# Patient Record
Sex: Male | Born: 1965
Health system: Southern US, Community
[De-identification: ages and names within clinical notes are randomized; demographics above are authoritative.]

## PROBLEM LIST (undated history)

## (undated) DIAGNOSIS — I10 Essential (primary) hypertension: Secondary | ICD-10-CM

---

## 2000-08-03 ENCOUNTER — Emergency Department (HOSPITAL_COMMUNITY): Admission: EM | Admit: 2000-08-03 | Discharge: 2000-08-04 | Payer: Self-pay | Admitting: Emergency Medicine

## 2000-08-03 ENCOUNTER — Encounter: Payer: Self-pay | Admitting: Emergency Medicine

## 2001-01-31 ENCOUNTER — Emergency Department (HOSPITAL_COMMUNITY): Admission: EM | Admit: 2001-01-31 | Discharge: 2001-01-31 | Payer: Self-pay | Admitting: Emergency Medicine

## 2001-09-22 ENCOUNTER — Emergency Department (HOSPITAL_COMMUNITY): Admission: EM | Admit: 2001-09-22 | Discharge: 2001-09-22 | Payer: Self-pay | Admitting: *Deleted

## 2002-10-09 ENCOUNTER — Ambulatory Visit (HOSPITAL_COMMUNITY): Admission: RE | Admit: 2002-10-09 | Discharge: 2002-10-09 | Payer: Self-pay | Admitting: Family Medicine

## 2002-10-09 ENCOUNTER — Encounter: Payer: Self-pay | Admitting: Family Medicine

## 2003-05-16 ENCOUNTER — Encounter: Payer: Self-pay | Admitting: Chiropractic Medicine

## 2003-05-16 ENCOUNTER — Ambulatory Visit (HOSPITAL_COMMUNITY): Admission: RE | Admit: 2003-05-16 | Discharge: 2003-05-16 | Payer: Self-pay | Admitting: Chiropractic Medicine

## 2003-07-13 ENCOUNTER — Emergency Department (HOSPITAL_COMMUNITY): Admission: AD | Admit: 2003-07-13 | Discharge: 2003-07-13 | Payer: Self-pay | Admitting: Family Medicine

## 2003-07-24 ENCOUNTER — Emergency Department (HOSPITAL_COMMUNITY): Admission: AD | Admit: 2003-07-24 | Discharge: 2003-07-24 | Payer: Self-pay | Admitting: Family Medicine

## 2004-03-03 ENCOUNTER — Ambulatory Visit (HOSPITAL_COMMUNITY): Admission: RE | Admit: 2004-03-03 | Discharge: 2004-03-03 | Payer: Self-pay | Admitting: Internal Medicine

## 2007-11-12 ENCOUNTER — Emergency Department (HOSPITAL_COMMUNITY): Admission: EM | Admit: 2007-11-12 | Discharge: 2007-11-12 | Payer: Self-pay | Admitting: Family Medicine

## 2009-04-11 ENCOUNTER — Emergency Department (HOSPITAL_COMMUNITY): Admission: EM | Admit: 2009-04-11 | Discharge: 2009-04-11 | Payer: Self-pay | Admitting: Emergency Medicine

## 2009-05-21 ENCOUNTER — Emergency Department (HOSPITAL_COMMUNITY): Admission: EM | Admit: 2009-05-21 | Discharge: 2009-05-21 | Payer: Self-pay | Admitting: Emergency Medicine

## 2010-03-27 ENCOUNTER — Emergency Department (HOSPITAL_COMMUNITY): Admission: EM | Admit: 2010-03-27 | Discharge: 2010-03-27 | Payer: Self-pay | Admitting: Emergency Medicine

## 2010-09-24 ENCOUNTER — Encounter: Payer: Self-pay | Admitting: Family Medicine

## 2011-05-04 ENCOUNTER — Emergency Department (HOSPITAL_COMMUNITY)
Admission: EM | Admit: 2011-05-04 | Discharge: 2011-05-05 | Disposition: A | Discharge status: Home / Self Care | Payer: Self-pay | Attending: Emergency Medicine | Admitting: Emergency Medicine

## 2011-05-04 DIAGNOSIS — R51 Headache: Secondary | ICD-10-CM | POA: Insufficient documentation

## 2011-05-04 DIAGNOSIS — I10 Essential (primary) hypertension: Secondary | ICD-10-CM | POA: Insufficient documentation

## 2011-05-28 LAB — POCT I-STAT, CHEM 8
HCT: 53 — ABNORMAL HIGH
Hemoglobin: 18 — ABNORMAL HIGH
Potassium: 4.1
Sodium: 137

## 2011-10-24 ENCOUNTER — Other Ambulatory Visit: Payer: Self-pay

## 2011-10-24 ENCOUNTER — Emergency Department (INDEPENDENT_AMBULATORY_CARE_PROVIDER_SITE_OTHER)
Admission: EM | Admit: 2011-10-24 | Discharge: 2011-10-24 | Disposition: A | Discharge status: Home Health | Payer: Self-pay | Source: Home / Self Care | Attending: Family Medicine | Admitting: Family Medicine

## 2011-10-24 ENCOUNTER — Encounter (HOSPITAL_COMMUNITY): Payer: Self-pay | Admitting: Emergency Medicine

## 2011-10-24 DIAGNOSIS — IMO0002 Reserved for concepts with insufficient information to code with codable children: Secondary | ICD-10-CM

## 2011-10-24 DIAGNOSIS — S46911A Strain of unspecified muscle, fascia and tendon at shoulder and upper arm level, right arm, initial encounter: Secondary | ICD-10-CM

## 2011-10-24 HISTORY — DX: Essential (primary) hypertension: I10

## 2011-10-24 MED ORDER — NAPROXEN 500 MG PO TABS: 500.0000 mg | ORAL_TABLET | Freq: Two times a day (BID) | ORAL | Status: AC

## 2011-10-24 MED ORDER — CYCLOBENZAPRINE HCL 5 MG PO TABS: 5.0000 mg | ORAL_TABLET | Freq: Every evening | ORAL | Status: AC | PRN

## 2011-10-24 NOTE — ED Provider Notes (Addendum)
History     CSN: [id number]  Arrival date & time 10/24/11  0906   First MD Initiated Contact with Patient 10/24/11 1018      Chief Complaint  Patient presents with  . Chest Pain    (Consider location/radiation/quality/duration/timing/severity/associated sxs/prior treatment) HPI Comments: Nathan Morris presents for evaluation of right-sided shoulder pain. He reports onset of symptoms around the [place] in early February. He denies any injury. He states these recently changed his pillows, but this was after the pain started. He does report that he change his mattress and box spring and early January. He recently bought a sleep number bed. He denies any numbness, tingling, or weakness in his upper extremities. He denies any chest pain. The pain is not exacerbated by movement and occurs at rest. He reports that putting his hand behind his head does help alleviate the pain. He reports that the pain is also alleviated with Tylenol and Alka-Seltzer. He also reports they used a hot water bottle, which helped also.  Patient is a 46 y.o. male presenting with shoulder pain. The history is provided by the patient.  Shoulder Pain This is a new problem. The current episode started more than 1 week ago. The problem has not changed since onset.Pertinent negatives include no chest pain and no headaches. The symptoms are aggravated by nothing. He has tried acetaminophen and a warm compress for the symptoms.    Past Medical History  Diagnosis Date  . Hypertension     History reviewed. No pertinent past surgical history.  No family history on file.  History  Substance Use Topics  . Smoking status: Not on file  . Smokeless tobacco: Not on file  . Alcohol Use: Yes      Review of Systems  Constitutional: Negative.   HENT: Negative.   Eyes: Negative.   Respiratory: Negative.   Cardiovascular: Negative.  Negative for chest pain.  Gastrointestinal: Negative.   Genitourinary: Negative.     Musculoskeletal: Positive for myalgias and arthralgias.  Skin: Negative.   Neurological: Negative.  Negative for headaches.    Allergies  Review of patient's allergies indicates no known allergies.  Home Medications   Current Outpatient Rx  Name Route Sig Dispense Refill  . CYCLOBENZAPRINE HCL 5 MG PO TABS Oral Take 1 tablet (5 mg total) by mouth at bedtime as needed for muscle spasms. 10 tablet 0  . NAPROXEN 500 MG PO TABS Oral Take 1 tablet (500 mg total) by mouth 2 (two) times daily. 30 tablet 0    BP 145/98  Pulse 92  Temp(Src) 98.9 F (37.2 C) (Oral)  Resp 16  SpO2 97%  Physical Exam  Nursing note and vitals reviewed. Constitutional: He is oriented to person, place, and time. He appears well-developed and well-nourished.  HENT:  Head: Normocephalic and atraumatic.  Eyes: EOM are normal.  Neck: Normal range of motion.  Cardiovascular: Normal rate and regular rhythm.        ECG: NSR, rate 75, no ST or T wave changes  Pulmonary/Chest: Effort normal and breath sounds normal. He has no wheezes. He has no rales.  Musculoskeletal: Normal range of motion.       Right shoulder: He exhibits tenderness and pain. He exhibits normal strength.       RIGHT shoulder: full abduction, adduction, flexion, and extension; 5/5 strength with internal and external rotation; 5/5 grip strength; negative Hawkin's, negative Ober's, negative Neer's, negative Speed's test; tenderness to palpation over RIGHT trapezius  Neurological: He is alert  and oriented to person, place, and time.  Skin: Skin is warm and dry.  Psychiatric: His behavior is normal.    ED Course  Procedures (including critical care time)  Labs Reviewed - No data to display No results found.   1. Muscle strain of right shoulder       MDM  rx given for naproxen and cyclobenzaprine PRN        Richardo Priest, MD 10/24/11 1108  Richardo Priest, MD 10/24/11 1109

## 2011-10-24 NOTE — Discharge Instructions (Signed)
Take medications as directed. Use mild heat (heating pad, warm baths, etc) for 10 to 15 minutes, two to three times daily, as needed and as tolerated, taking care to not burn the skin. Begin stretches and exercises, as instructed in handouts, after 48 hours. Return to care should your symptoms not improve, or worsen in any way, such as numbness, weakness, or tingling.   

## 2011-10-24 NOTE — ED Notes (Signed)
PT HERE WITH RIGHT CHEST WALL PAIN THAT RADIATES TO RIGHT SHOULDER INTERMITT.SX ACHY PAIN THAT STARTED X3 WEEKS AGO BUT HAS PROGRESSED WITH LYING DOWN OR CERTAIN MOVEMENT.HXHTN AND TAKES BP MEDS DAILY

## 2011-10-31 ENCOUNTER — Emergency Department (INDEPENDENT_AMBULATORY_CARE_PROVIDER_SITE_OTHER)
Admission: EM | Admit: 2011-10-31 | Discharge: 2011-10-31 | Disposition: A | Discharge status: Home / Self Care | Payer: Self-pay | Source: Home / Self Care | Attending: Emergency Medicine | Admitting: Emergency Medicine

## 2011-10-31 ENCOUNTER — Telehealth (HOSPITAL_COMMUNITY): Payer: Self-pay | Admitting: *Deleted

## 2011-10-31 ENCOUNTER — Encounter (HOSPITAL_COMMUNITY): Payer: Self-pay | Admitting: *Deleted

## 2011-10-31 ENCOUNTER — Emergency Department (INDEPENDENT_AMBULATORY_CARE_PROVIDER_SITE_OTHER): Payer: Self-pay

## 2011-10-31 DIAGNOSIS — M25519 Pain in unspecified shoulder: Secondary | ICD-10-CM

## 2011-10-31 MED ORDER — TRAMADOL HCL 50 MG PO TABS: 50.0000 mg | ORAL_TABLET | Freq: Four times a day (QID) | ORAL | Status: AC | PRN

## 2011-10-31 NOTE — ED Notes (Signed)
Pt. called on VM @ 1518 but did not leave a message. I called back and he said he was at the [facility] now being seen. I said that was OK and asked his name to document call. Nathan Morris 10/31/2011

## 2011-10-31 NOTE — Discharge Instructions (Signed)
Arthralgia Arthralgia is joint pain. A joint is a place where two bones meet. Joint pain can happen for many reasons. The joint can be bruised, stiff, infected, or weak from aging. Pain usually goes away after resting and taking medicine for soreness.  HOME CARE  Rest the joint as told by your doctor.   Keep the sore joint raised (elevated) for the first 24 hours.   Put ice on the joint area.   Put ice in a plastic bag.   Place a towel between your skin and the bag   Leave the ice on for 15 to 20 minutes, 3 to 4 times a day.   Wear your splint, casting, elastic bandage, or sling as told by your doctor.   Only take medicine as told by your doctor. Do not take aspirin.   Use crutches as told by your doctor. Do not put weight on the joint until told to by your doctor.  GET HELP RIGHT AWAY IF:   You have bruising, puffiness (swelling), or more pain.   Your fingers or toes turn blue or start to lose feeling (numb).   Your medicine does not lessen the pain.   Your pain becomes severe.   You have a temperature by mouth above 102 F (38.9 C), not controlled by medicine.   You cannot move or use the joint.  MAKE SURE YOU:   Understand these instructions.   Will watch your condition.   Will get help right away if you are not doing well or get worse.  Document Released: 08/08/2009 Document Revised: 05/02/2011 Document Reviewed: 08/08/2009 [facility] Patient Information 2012 [place], [place].

## 2011-10-31 NOTE — ED Notes (Signed)
Pt states he has constant right sided chest pain for about 4 weeks.  Seen here last week and given Naprosyn and Flexeril which he has been taking along with Ibuprofen.  States pain doesn't go away.  Also now coughing up brown mucus.  Instructed pt not to take Ibuprofen and Naprosyn together.

## 2011-10-31 NOTE — ED Provider Notes (Signed)
Nathan Morris is a 46 y.o. male who presents to Urgent Care today for shoulder pain and right chest pain. Patient was seen here at urgent care about 7 days ago with similar symptoms. He states that the symptoms have improved whenever he takes naproxen or ibuprofen. He has returned today to make sure that there is nothing else going on and is requesting a chest x-ray to make sure he doesn't have any rib injury. He states there were some days where he took both of ibuprofen and naproxen together. He states he had one episode of cough about 3 days ago he coughed up some "brown stuff." This was not even enough to fill the bottom of a very small cup. He has not done this since then and does not have any other cough. However he does endorse some night sweats, polyuria, polydipsia. He states he awakens at 2 times a night to urinate. No trauma to the shoulder or chest. No dyspnea on exertion. No chest pain on exertion. States he feels pain mostly when lifting arm above head or trying to lift something at work. Describes pain as a sharp steady pain or aching. No lower extremity edema. No abdominal pain, nausea, vomiting.   PMH reviewed.  ROS as above otherwise neg Medications reviewed. No current facility-administered medications for this encounter.   Current Outpatient Prescriptions  Medication Sig Dispense Refill  . HYDROCHLOROTHIAZIDE PO Take by mouth.      . cyclobenzaprine (FLEXERIL) 5 MG tablet Take 1 tablet (5 mg total) by mouth at bedtime as needed for muscle spasms.  10 tablet  0  . naproxen (NAPROSYN) 500 MG tablet Take 1 tablet (500 mg total) by mouth 2 (two) times daily.  30 tablet  0    Exam:  BP 146/96  Pulse 83  Temp(Src) 98.3 F (36.8 C) (Oral)  Resp 16  SpO2 98% Gen: Well NAD HEENT: EOMI,  MMM Lungs: CTABL Nl WOB Chest: Mild tenderness to palpation right a.c. joint, right pectoralis major muscle the axilla. Heart: RRR no MRG Abd: NABS, NT, ND Exts: Non edematous BL  LE, warm  and well perfused.   I have reviewed imaging, imaging report, and lab results and agree with read.   Assessment and Plan:  1.  musculoskeletal chest and shoulder pain: This pain does improve with anti-inflammatory and is worsened with movement and he has no history of cardiac disease or any cardiac risk factors plan to continue to treat with anti-inflammatories. Gave warnings regarding taking ibuprofen and naproxen together and asked him to site which helps him more and to only use that. Did obtain chest x-ray which was negative for any pulmonary process or rib pathology. He does have a PCP and refill asked him to followup with his PCP in the next several days to ensure he is continued to improve.  #2 polyuria/polydipsia: Glucose obtained on I-Stat chemistry panel was 86. He last had a male several hours ago. I do not believe he has concerns for diabetes but did recommend him to followup with his primary care provider next week to ensure this. May need A1c at that time.

## 2011-11-01 LAB — POCT I-STAT, CHEM 8
Calcium, Ion: 1.3 mmol/L (ref 1.12–1.32)
Chloride: 110 mEq/L (ref 96–112)
HCT: 47 % (ref 39.0–52.0)
Hemoglobin: 16 g/dL (ref 13.0–17.0)
Potassium: 4.7 mEq/L (ref 3.5–5.1)

## 2011-11-02 NOTE — ED Provider Notes (Signed)
Medical screening examination/treatment/procedure(s) were performed by resident physician or non-physician practitioner and as supervising physician I was immediately available for consultation/collaboration.   Barkley Bruns MD.    Barkley Bruns, MD 11/02/11 [phone]

## 2015-11-23 ENCOUNTER — Emergency Department (HOSPITAL_COMMUNITY): Payer: Self-pay

## 2015-11-23 ENCOUNTER — Emergency Department (HOSPITAL_COMMUNITY)
Admission: EM | Admit: 2015-11-23 | Discharge: 2015-11-23 | Disposition: A | Discharge status: Home / Self Care | Payer: Self-pay | Attending: Emergency Medicine | Admitting: Emergency Medicine

## 2015-11-23 ENCOUNTER — Encounter (HOSPITAL_COMMUNITY): Payer: Self-pay | Admitting: Emergency Medicine

## 2015-11-23 DIAGNOSIS — I10 Essential (primary) hypertension: Secondary | ICD-10-CM | POA: Insufficient documentation

## 2015-11-23 DIAGNOSIS — R52 Pain, unspecified: Secondary | ICD-10-CM | POA: Insufficient documentation

## 2015-11-23 DIAGNOSIS — R05 Cough: Secondary | ICD-10-CM | POA: Insufficient documentation

## 2015-11-23 NOTE — ED Notes (Signed)
Patient here with complaints of cough, generalized body aches x1 week. Reports being out of blood pressure medication x1 week.

## 2017-06-27 DIAGNOSIS — M25511 Pain in right shoulder: Secondary | ICD-10-CM | POA: Diagnosis not present

## 2017-06-27 DIAGNOSIS — R001 Bradycardia, unspecified: Secondary | ICD-10-CM | POA: Diagnosis not present

## 2017-06-27 DIAGNOSIS — R109 Unspecified abdominal pain: Secondary | ICD-10-CM | POA: Diagnosis not present

## 2017-07-01 DIAGNOSIS — M5412 Radiculopathy, cervical region: Secondary | ICD-10-CM | POA: Diagnosis not present

## 2017-07-01 DIAGNOSIS — M25511 Pain in right shoulder: Secondary | ICD-10-CM | POA: Diagnosis not present

## 2017-07-02 DIAGNOSIS — R001 Bradycardia, unspecified: Secondary | ICD-10-CM | POA: Diagnosis not present

## 2017-07-09 DIAGNOSIS — R55 Syncope and collapse: Secondary | ICD-10-CM | POA: Diagnosis not present

## 2017-07-09 DIAGNOSIS — R001 Bradycardia, unspecified: Secondary | ICD-10-CM | POA: Diagnosis not present

## 2017-07-16 DIAGNOSIS — R001 Bradycardia, unspecified: Secondary | ICD-10-CM | POA: Diagnosis not present

## 2017-07-18 DIAGNOSIS — M5020 Other cervical disc displacement, unspecified cervical region: Secondary | ICD-10-CM | POA: Diagnosis not present

## 2017-07-18 DIAGNOSIS — Z135 Encounter for screening for eye and ear disorders: Secondary | ICD-10-CM | POA: Diagnosis not present

## 2017-07-18 DIAGNOSIS — M2578 Osteophyte, vertebrae: Secondary | ICD-10-CM | POA: Diagnosis not present

## 2017-10-16 ENCOUNTER — Ambulatory Visit (HOSPITAL_COMMUNITY)
Admission: EM | Admit: 2017-10-16 | Discharge: 2017-10-16 | Disposition: A | Discharge status: Home / Self Care | Payer: 59 | Attending: Family Medicine | Admitting: Family Medicine

## 2017-10-16 ENCOUNTER — Encounter (HOSPITAL_COMMUNITY): Payer: Self-pay | Admitting: Emergency Medicine

## 2017-10-16 ENCOUNTER — Other Ambulatory Visit: Payer: Self-pay

## 2017-10-16 DIAGNOSIS — K5900 Constipation, unspecified: Secondary | ICD-10-CM | POA: Diagnosis not present

## 2017-10-16 MED ORDER — HYDROCHLOROTHIAZIDE 25 MG PO TABS: 25.0000 mg | ORAL_TABLET | Freq: Every day | ORAL | 1 refills | Status: AC

## 2017-10-16 NOTE — ED Triage Notes (Signed)
Pt reports right flank pain and no BM x2 days.  He states he had one very small BM that was small hard balls.  He has not taken anything OTC.

## 2017-10-16 NOTE — ED Provider Notes (Signed)
  [place]   [id number] 10/16/17 Arrival Time: 4332  ASSESSMENT & PLAN:  1. Constipation, unspecified constipation type    Trial of OTC medications; see AVS. May f/u here if not improving over the next couple of days.  Reviewed expectations re: course of current medical issues. Questions answered. Outlined signs and symptoms indicating need for more acute intervention. Patient verbalized understanding. After Visit Summary given.   SUBJECTIVE:  Nathan Morris is a 52 y.o. male who presents with complaint of constipation for 2 days. Last BM small and without blood. Similar symptoms about 10 years ago that resolved quickly. No new medications or recent changes in his health. Normal appetite and PO intake. Passing gas which provides a little relief. No abdominal pain "but feel a little bloated." Afebrile. No urinary symptoms. No OTC treatment. No specific aggravating factors reported.  History reviewed. No pertinent surgical history.  ROS: As per HPI.  OBJECTIVE:  Vitals:   10/16/17 1145  BP: (!) 164/107  Pulse: 66  Temp: 97.7 F (36.5 C)  TempSrc: Oral  SpO2: 96%    General appearance: alert; no distress Lungs: clear to auscultation bilaterally Heart: regular rate and rhythm Abdomen: soft; non-distended; no tenderness; bowel sounds present; no masses or organomegaly; no guarding or rebound tenderness Back: no CVA tenderness; FROM at hips Extremities: no edema; symmetrical with no gross deformities Skin: warm and dry Neurologic: normal gait Psychological: alert and cooperative; normal mood and affect  No Known Allergies                                             Past Medical History:  Diagnosis Date  . Hypertension    Social History   Socioeconomic History  . Marital status: Single    Spouse name: Not on file  . Number of children: Not on file  . Years of education: Not on file  . Highest education level: Not on file  Social Needs  . Financial  resource strain: Not on file  . Food insecurity - worry: Not on file  . Food insecurity - inability: Not on file  . Transportation needs - medical: Not on file  . Transportation needs - non-medical: Not on file  Occupational History  . Not on file  Tobacco Use  . Smoking status: Never Smoker  . Smokeless tobacco: Never Used  Substance and Sexual Activity  . Alcohol use: Yes  . Drug use: Yes    Types: Marijuana    Comment: frequent use  . Sexual activity: Not on file  Other Topics Concern  . Not on file  Social History Narrative  . Not on file   Family History  Problem Relation Age of Onset  . Hypertension Mother      Vanessa Kick, MD 10/16/17 1324

## 2017-10-16 NOTE — Discharge Instructions (Addendum)
For your constipation you may try over the counter Miralax. If this does not produce a bowel movement within two days you may try over the counter magnesium citrate.  Please return here or to the Emergency Department immediately should you feel worse in any way or have any of the following symptoms: increasing or different abdominal pain, persistent vomiting, fevers, or shaking chills.

## 2017-11-28 ENCOUNTER — Other Ambulatory Visit: Payer: Self-pay | Admitting: Family Medicine

## 2017-11-28 DIAGNOSIS — N442 Benign cyst of testis: Secondary | ICD-10-CM

## 2017-11-28 DIAGNOSIS — J301 Allergic rhinitis due to pollen: Secondary | ICD-10-CM | POA: Diagnosis not present

## 2017-12-02 ENCOUNTER — Other Ambulatory Visit: Payer: 59

## 2017-12-10 ENCOUNTER — Ambulatory Visit
Admission: RE | Admit: 2017-12-10 | Discharge: 2017-12-10 | Disposition: A | Discharge status: Home / Self Care | Payer: 59 | Source: Ambulatory Visit | Attending: Family Medicine | Admitting: Family Medicine

## 2017-12-10 DIAGNOSIS — N442 Benign cyst of testis: Secondary | ICD-10-CM

## 2017-12-10 DIAGNOSIS — N509 Disorder of male genital organs, unspecified: Secondary | ICD-10-CM | POA: Diagnosis not present

## 2018-01-10 DIAGNOSIS — D2931 Benign neoplasm of right epididymis: Secondary | ICD-10-CM | POA: Diagnosis not present

## 2018-02-13 DIAGNOSIS — M25511 Pain in right shoulder: Secondary | ICD-10-CM | POA: Diagnosis not present

## 2018-04-04 DIAGNOSIS — Z0000 Encounter for general adult medical examination without abnormal findings: Secondary | ICD-10-CM | POA: Diagnosis not present

## 2018-04-04 DIAGNOSIS — I10 Essential (primary) hypertension: Secondary | ICD-10-CM | POA: Diagnosis not present

## 2018-06-12 DIAGNOSIS — D124 Benign neoplasm of descending colon: Secondary | ICD-10-CM | POA: Diagnosis not present

## 2018-06-12 DIAGNOSIS — Z1211 Encounter for screening for malignant neoplasm of colon: Secondary | ICD-10-CM | POA: Diagnosis not present

## 2018-06-12 DIAGNOSIS — K635 Polyp of colon: Secondary | ICD-10-CM | POA: Diagnosis not present

## 2018-08-06 DIAGNOSIS — H2513 Age-related nuclear cataract, bilateral: Secondary | ICD-10-CM | POA: Diagnosis not present

## 2018-08-06 DIAGNOSIS — H25013 Cortical age-related cataract, bilateral: Secondary | ICD-10-CM | POA: Diagnosis not present

## 2018-08-06 DIAGNOSIS — H40013 Open angle with borderline findings, low risk, bilateral: Secondary | ICD-10-CM | POA: Diagnosis not present

## 2018-11-06 DIAGNOSIS — R062 Wheezing: Secondary | ICD-10-CM | POA: Diagnosis not present

## 2018-11-06 DIAGNOSIS — J988 Other specified respiratory disorders: Secondary | ICD-10-CM | POA: Diagnosis not present

## 2019-05-18 ENCOUNTER — Other Ambulatory Visit: Payer: Self-pay | Admitting: Family Medicine

## 2019-05-18 ENCOUNTER — Ambulatory Visit
Admission: RE | Admit: 2019-05-18 | Discharge: 2019-05-18 | Disposition: A | Discharge status: Home / Self Care | Payer: 59 | Source: Ambulatory Visit | Attending: Family Medicine | Admitting: Family Medicine

## 2019-05-18 DIAGNOSIS — R079 Chest pain, unspecified: Secondary | ICD-10-CM

## 2019-08-06 ENCOUNTER — Other Ambulatory Visit: Payer: Self-pay

## 2019-08-06 DIAGNOSIS — Z20822 Contact with and (suspected) exposure to covid-19: Secondary | ICD-10-CM

## 2019-08-09 LAB — NOVEL CORONAVIRUS, NAA: SARS-CoV-2, NAA: NOT DETECTED

## 2019-09-15 DIAGNOSIS — Z20828 Contact with and (suspected) exposure to other viral communicable diseases: Secondary | ICD-10-CM | POA: Diagnosis not present

## 2019-09-15 DIAGNOSIS — K591 Functional diarrhea: Secondary | ICD-10-CM | POA: Diagnosis not present

## 2019-09-15 DIAGNOSIS — R197 Diarrhea, unspecified: Secondary | ICD-10-CM | POA: Diagnosis not present

## 2019-09-15 DIAGNOSIS — R519 Headache, unspecified: Secondary | ICD-10-CM | POA: Diagnosis not present

## 2019-09-15 DIAGNOSIS — R05 Cough: Secondary | ICD-10-CM | POA: Diagnosis not present

## 2019-09-24 ENCOUNTER — Other Ambulatory Visit: Payer: Self-pay | Admitting: Family Medicine

## 2019-09-24 ENCOUNTER — Ambulatory Visit
Admission: RE | Admit: 2019-09-24 | Discharge: 2019-09-24 | Disposition: A | Discharge status: Home / Self Care | Payer: BC Managed Care – PPO | Source: Ambulatory Visit | Attending: Family Medicine | Admitting: Family Medicine

## 2019-09-24 DIAGNOSIS — M79671 Pain in right foot: Secondary | ICD-10-CM

## 2019-10-26 ENCOUNTER — Encounter (HOSPITAL_COMMUNITY): Payer: Self-pay | Admitting: Emergency Medicine

## 2019-10-26 ENCOUNTER — Other Ambulatory Visit: Payer: Self-pay

## 2019-10-26 ENCOUNTER — Emergency Department (HOSPITAL_COMMUNITY): Payer: BC Managed Care – PPO

## 2019-10-26 ENCOUNTER — Emergency Department (HOSPITAL_COMMUNITY)
Admission: EM | Admit: 2019-10-26 | Discharge: 2019-10-26 | Disposition: A | Discharge status: Home / Self Care | Payer: BC Managed Care – PPO | Attending: Emergency Medicine | Admitting: Emergency Medicine

## 2019-10-26 DIAGNOSIS — M542 Cervicalgia: Secondary | ICD-10-CM | POA: Diagnosis not present

## 2019-10-26 DIAGNOSIS — M546 Pain in thoracic spine: Secondary | ICD-10-CM | POA: Diagnosis not present

## 2019-10-26 DIAGNOSIS — R519 Headache, unspecified: Secondary | ICD-10-CM | POA: Diagnosis not present

## 2019-10-26 DIAGNOSIS — Y939 Activity, unspecified: Secondary | ICD-10-CM | POA: Diagnosis not present

## 2019-10-26 DIAGNOSIS — I10 Essential (primary) hypertension: Secondary | ICD-10-CM | POA: Diagnosis not present

## 2019-10-26 DIAGNOSIS — Z79899 Other long term (current) drug therapy: Secondary | ICD-10-CM | POA: Diagnosis not present

## 2019-10-26 DIAGNOSIS — M62838 Other muscle spasm: Secondary | ICD-10-CM | POA: Insufficient documentation

## 2019-10-26 DIAGNOSIS — S299XXA Unspecified injury of thorax, initial encounter: Secondary | ICD-10-CM | POA: Diagnosis not present

## 2019-10-26 DIAGNOSIS — Y92410 Unspecified street and highway as the place of occurrence of the external cause: Secondary | ICD-10-CM | POA: Insufficient documentation

## 2019-10-26 DIAGNOSIS — S199XXA Unspecified injury of neck, initial encounter: Secondary | ICD-10-CM | POA: Diagnosis not present

## 2019-10-26 DIAGNOSIS — Y999 Unspecified external cause status: Secondary | ICD-10-CM | POA: Insufficient documentation

## 2019-10-26 DIAGNOSIS — S0990XA Unspecified injury of head, initial encounter: Secondary | ICD-10-CM | POA: Diagnosis not present

## 2019-10-26 MED ORDER — HYDROCHLOROTHIAZIDE 12.5 MG PO CAPS
25.0000 mg | ORAL_CAPSULE | Freq: Once | ORAL | Status: AC
  Administered 2019-10-26: 25 mg via ORAL
  Filled 2019-10-26: qty 2

## 2019-10-26 MED ORDER — CYCLOBENZAPRINE HCL 10 MG PO TABS: 10.0000 mg | ORAL_TABLET | Freq: Two times a day (BID) | ORAL | 0 refills | Status: AC | PRN
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
## 2019-10-26 NOTE — ED Notes (Signed)
Pt transported to CT/Xray via stretcher. A/Ox3.  NAD. No complaints voiced.
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
## 2019-12-09 DIAGNOSIS — I10 Essential (primary) hypertension: Secondary | ICD-10-CM | POA: Diagnosis not present

## 2019-12-09 DIAGNOSIS — Z13220 Encounter for screening for lipoid disorders: Secondary | ICD-10-CM | POA: Diagnosis not present

## 2019-12-09 DIAGNOSIS — Z0000 Encounter for general adult medical examination without abnormal findings: Secondary | ICD-10-CM | POA: Diagnosis not present

## 2019-12-09 DIAGNOSIS — Z125 Encounter for screening for malignant neoplasm of prostate: Secondary | ICD-10-CM | POA: Diagnosis not present

## 2019-12-31 ENCOUNTER — Ambulatory Visit: Payer: BC Managed Care – PPO | Admitting: Podiatry

## 2020-01-04 ENCOUNTER — Other Ambulatory Visit: Payer: Self-pay | Admitting: Podiatry

## 2020-01-04 ENCOUNTER — Other Ambulatory Visit: Payer: Self-pay

## 2020-01-04 ENCOUNTER — Ambulatory Visit (INDEPENDENT_AMBULATORY_CARE_PROVIDER_SITE_OTHER): Payer: BC Managed Care – PPO

## 2020-01-04 ENCOUNTER — Ambulatory Visit: Payer: BC Managed Care – PPO | Admitting: Podiatry

## 2020-01-04 ENCOUNTER — Encounter: Payer: Self-pay | Admitting: Podiatry

## 2020-01-04 VITALS — Temp 98.2°F

## 2020-01-04 DIAGNOSIS — M722 Plantar fascial fibromatosis: Secondary | ICD-10-CM

## 2020-01-04 DIAGNOSIS — R52 Pain, unspecified: Secondary | ICD-10-CM

## 2020-01-04 MED ORDER — DICLOFENAC SODIUM 75 MG PO TBEC: 75.0000 mg | DELAYED_RELEASE_TABLET | Freq: Two times a day (BID) | ORAL | 2 refills | Status: AC

## 2020-01-04 NOTE — Patient Instructions (Signed)

## 2020-01-06 NOTE — Progress Notes (Signed)
Subjective:   Patient ID: Nathan Morris, male   DOB: 54 y.o.   MRN: [id number]   HPI Patient presents stating getting a lot of pain in the bottom of the right heel that has been aching and sometimes stabbing and its been hurting for several months.  Patient is tried shoe gear modifications ice at times and patient does not smoke and likes to be active   Review of Systems  All other systems reviewed and are negative.       Objective:  Physical Exam Vitals and nursing note reviewed.  Constitutional:      Appearance: He is well-developed.  Pulmonary:     Effort: Pulmonary effort is normal.  Musculoskeletal:        General: Normal range of motion.  Skin:    General: Skin is warm.  Neurological:     Mental Status: He is alert.     Neurovascular status found to be intact muscle strength was found to be adequate range of motion within normal limits.  Patient is found to have exquisite discomfort plantar aspect right heel at the insertional point of the tendon into the calcaneus with inflammation fluid of the medial band.  Patient is found to have good digital perfusion well oriented x3 moderate depression of the arch and moderate equinus condition noted     Assessment:  Acute plantar fasciitis right with inflammation fluid of the medial band at insertion calcaneus     Plan:  H&P x-ray reviewed and today I went ahead and injected the plantar fascial after sterile prep 3 mg Kenalog 5 mg Xylocaine applied fascial brace gave instructions for anti-inflammatories physical therapy and support and patient will be seen back to recheck and is instructed to reduce activity for several days  X-rays indicate small spur no indication to stress fracture arthritis

## 2020-01-11 DIAGNOSIS — R7309 Other abnormal glucose: Secondary | ICD-10-CM | POA: Diagnosis not present

## 2020-01-20 ENCOUNTER — Other Ambulatory Visit: Payer: Self-pay

## 2020-01-20 ENCOUNTER — Encounter: Payer: Self-pay | Admitting: Podiatry

## 2020-01-20 ENCOUNTER — Ambulatory Visit: Payer: BC Managed Care – PPO | Admitting: Podiatry

## 2020-01-20 VITALS — Temp 97.6°F

## 2020-01-20 DIAGNOSIS — M722 Plantar fascial fibromatosis: Secondary | ICD-10-CM | POA: Diagnosis not present

## 2020-01-20 NOTE — Progress Notes (Signed)
Subjective:   Patient ID: Nathan Morris, male   DOB: 54 y.o.   MRN: [id number]   HPI Patient presents stating he is improved quite a bit with a significant diminishment of his discomfort   ROS      Objective:  Physical Exam  Neurovascular status intact with inflammation right doing much better with pain only present upon deep palpation but significantly improved     Assessment:  Doing well with plantar fascial inflammation right     Plan:  H&P reviewed condition and recommended continued anti-inflammatories physical therapy and supportive shoe gear usage.  Patient will be seen back as needed and may require other treatments depending on response

## 2020-04-25 DIAGNOSIS — S76112A Strain of left quadriceps muscle, fascia and tendon, initial encounter: Secondary | ICD-10-CM | POA: Diagnosis not present

## 2020-05-02 DIAGNOSIS — S86819A Strain of other muscle(s) and tendon(s) at lower leg level, unspecified leg, initial encounter: Secondary | ICD-10-CM | POA: Diagnosis not present

## 2020-07-07 DIAGNOSIS — L270 Generalized skin eruption due to drugs and medicaments taken internally: Secondary | ICD-10-CM | POA: Diagnosis not present

## 2020-07-07 DIAGNOSIS — I10 Essential (primary) hypertension: Secondary | ICD-10-CM | POA: Diagnosis not present

## 2020-09-08 DIAGNOSIS — U071 COVID-19: Secondary | ICD-10-CM | POA: Diagnosis not present

## 2020-09-26 DIAGNOSIS — S76311A Strain of muscle, fascia and tendon of the posterior muscle group at thigh level, right thigh, initial encounter: Secondary | ICD-10-CM | POA: Diagnosis not present

## 2020-09-26 DIAGNOSIS — W108XXA Fall (on) (from) other stairs and steps, initial encounter: Secondary | ICD-10-CM | POA: Diagnosis not present

## 2020-10-03 DIAGNOSIS — S76311D Strain of muscle, fascia and tendon of the posterior muscle group at thigh level, right thigh, subsequent encounter: Secondary | ICD-10-CM | POA: Diagnosis not present

## 2020-11-10 DIAGNOSIS — J309 Allergic rhinitis, unspecified: Secondary | ICD-10-CM | POA: Diagnosis not present

## 2020-11-10 DIAGNOSIS — I10 Essential (primary) hypertension: Secondary | ICD-10-CM | POA: Diagnosis not present

## 2020-11-10 DIAGNOSIS — R062 Wheezing: Secondary | ICD-10-CM | POA: Diagnosis not present

## 2020-12-12 DIAGNOSIS — N529 Male erectile dysfunction, unspecified: Secondary | ICD-10-CM | POA: Diagnosis not present

## 2020-12-12 DIAGNOSIS — Z125 Encounter for screening for malignant neoplasm of prostate: Secondary | ICD-10-CM | POA: Diagnosis not present

## 2020-12-12 DIAGNOSIS — R7309 Other abnormal glucose: Secondary | ICD-10-CM | POA: Diagnosis not present

## 2020-12-12 DIAGNOSIS — I10 Essential (primary) hypertension: Secondary | ICD-10-CM | POA: Diagnosis not present

## 2020-12-12 DIAGNOSIS — J301 Allergic rhinitis due to pollen: Secondary | ICD-10-CM | POA: Diagnosis not present

## 2020-12-12 DIAGNOSIS — Z0000 Encounter for general adult medical examination without abnormal findings: Secondary | ICD-10-CM | POA: Diagnosis not present

## 2021-01-26 DIAGNOSIS — I10 Essential (primary) hypertension: Secondary | ICD-10-CM | POA: Diagnosis not present

## 2021-01-26 DIAGNOSIS — M25562 Pain in left knee: Secondary | ICD-10-CM | POA: Diagnosis not present

## 2021-02-20 DIAGNOSIS — M1712 Unilateral primary osteoarthritis, left knee: Secondary | ICD-10-CM | POA: Diagnosis not present

## 2021-03-10 DIAGNOSIS — J029 Acute pharyngitis, unspecified: Secondary | ICD-10-CM | POA: Diagnosis not present

## 2021-03-10 DIAGNOSIS — Z20828 Contact with and (suspected) exposure to other viral communicable diseases: Secondary | ICD-10-CM | POA: Diagnosis not present

## 2021-03-10 DIAGNOSIS — R051 Acute cough: Secondary | ICD-10-CM | POA: Diagnosis not present

## 2021-03-10 DIAGNOSIS — R519 Headache, unspecified: Secondary | ICD-10-CM | POA: Diagnosis not present

## 2021-04-18 DIAGNOSIS — M25562 Pain in left knee: Secondary | ICD-10-CM | POA: Diagnosis not present

## 2021-04-19 ENCOUNTER — Other Ambulatory Visit: Payer: Self-pay | Admitting: Sports Medicine

## 2021-04-19 ENCOUNTER — Ambulatory Visit
Admission: RE | Admit: 2021-04-19 | Discharge: 2021-04-19 | Disposition: A | Discharge status: Home / Self Care | Payer: BC Managed Care – PPO | Source: Ambulatory Visit | Attending: Sports Medicine | Admitting: Sports Medicine

## 2021-04-19 DIAGNOSIS — M25562 Pain in left knee: Secondary | ICD-10-CM

## 2021-04-19 DIAGNOSIS — M1712 Unilateral primary osteoarthritis, left knee: Secondary | ICD-10-CM | POA: Diagnosis not present

## 2021-07-25 DIAGNOSIS — J069 Acute upper respiratory infection, unspecified: Secondary | ICD-10-CM | POA: Diagnosis not present

## 2021-07-25 DIAGNOSIS — Z20828 Contact with and (suspected) exposure to other viral communicable diseases: Secondary | ICD-10-CM | POA: Diagnosis not present

## 2021-07-25 DIAGNOSIS — J3489 Other specified disorders of nose and nasal sinuses: Secondary | ICD-10-CM | POA: Diagnosis not present

## 2021-07-25 DIAGNOSIS — R0981 Nasal congestion: Secondary | ICD-10-CM | POA: Diagnosis not present

## 2021-07-25 DIAGNOSIS — R519 Headache, unspecified: Secondary | ICD-10-CM | POA: Diagnosis not present

## 2021-09-03 IMAGING — DX DG FOOT 2V*R*
2 series · 2 of 2 positions shown · non-contrast
Comparison: None.

CLINICAL DATA: Right heel pain x3 weeks.

EXAM:
RIGHT FOOT - 2 VIEW

[dg foot 2 views right (1 of 2)]
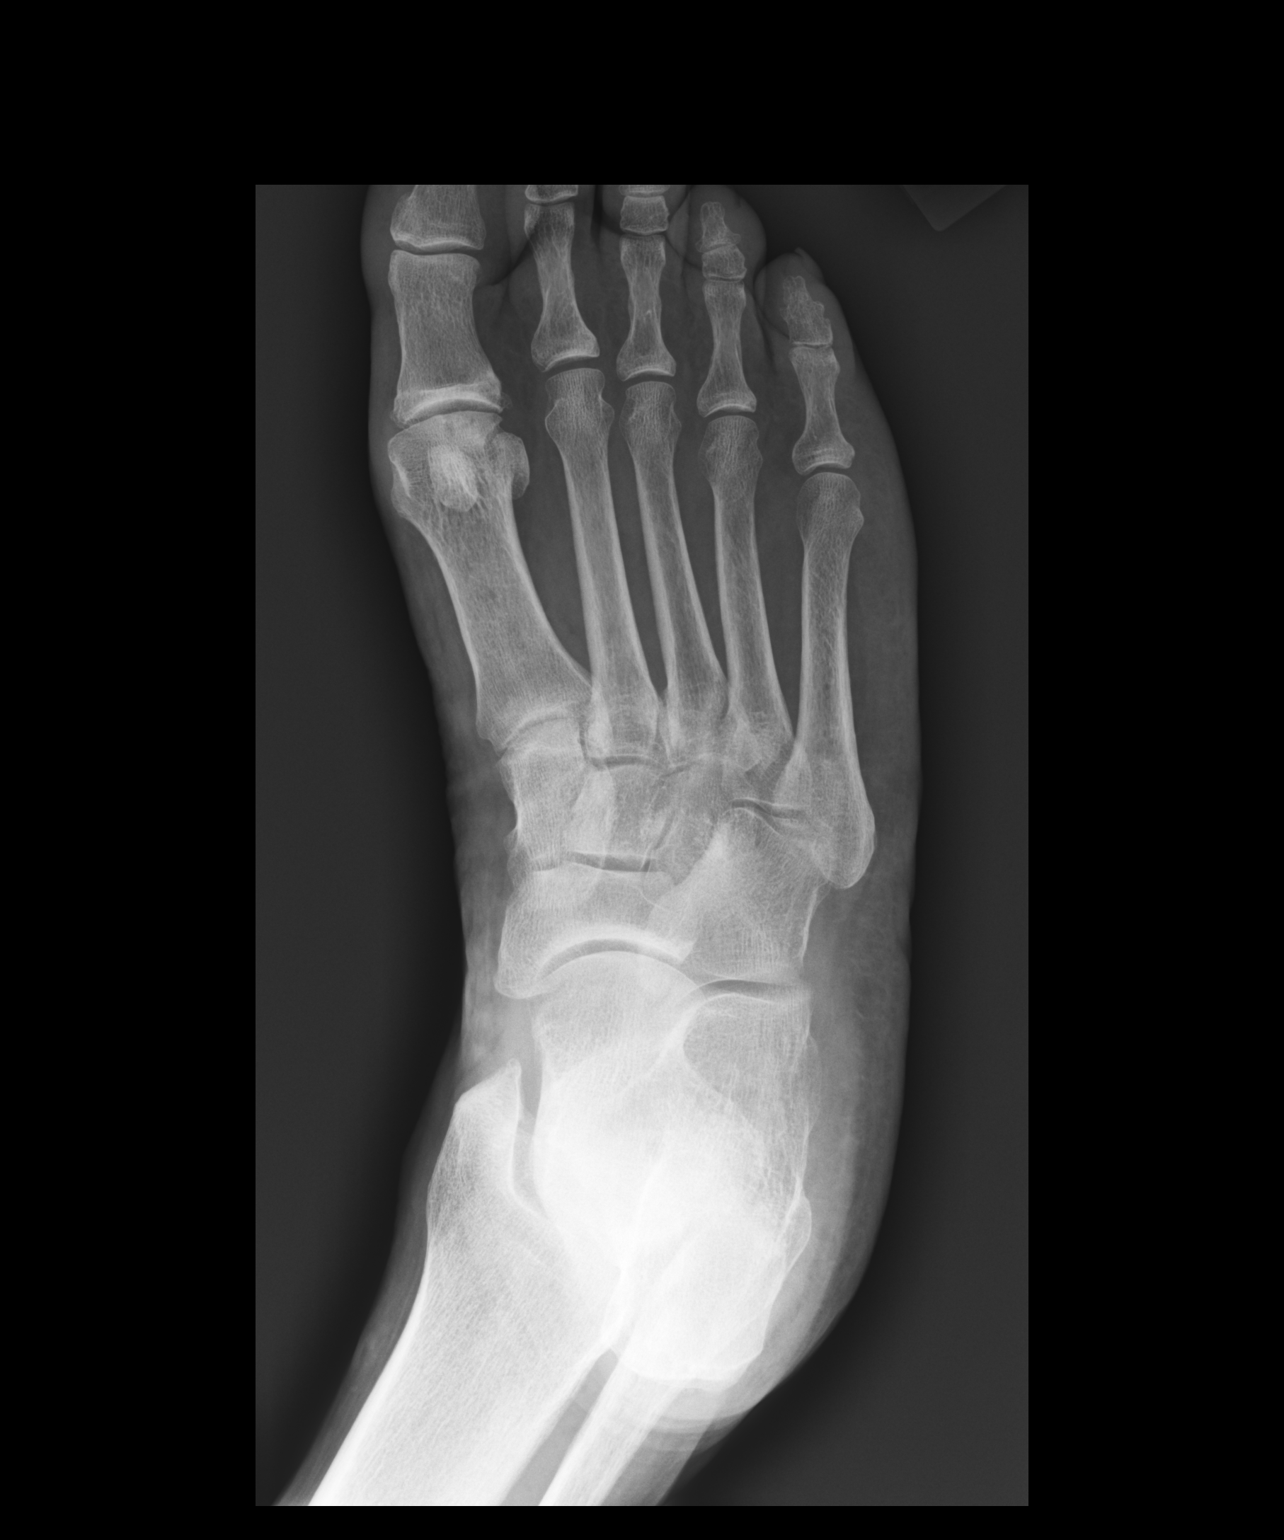

[dg foot 2 views right (2 of 2)]
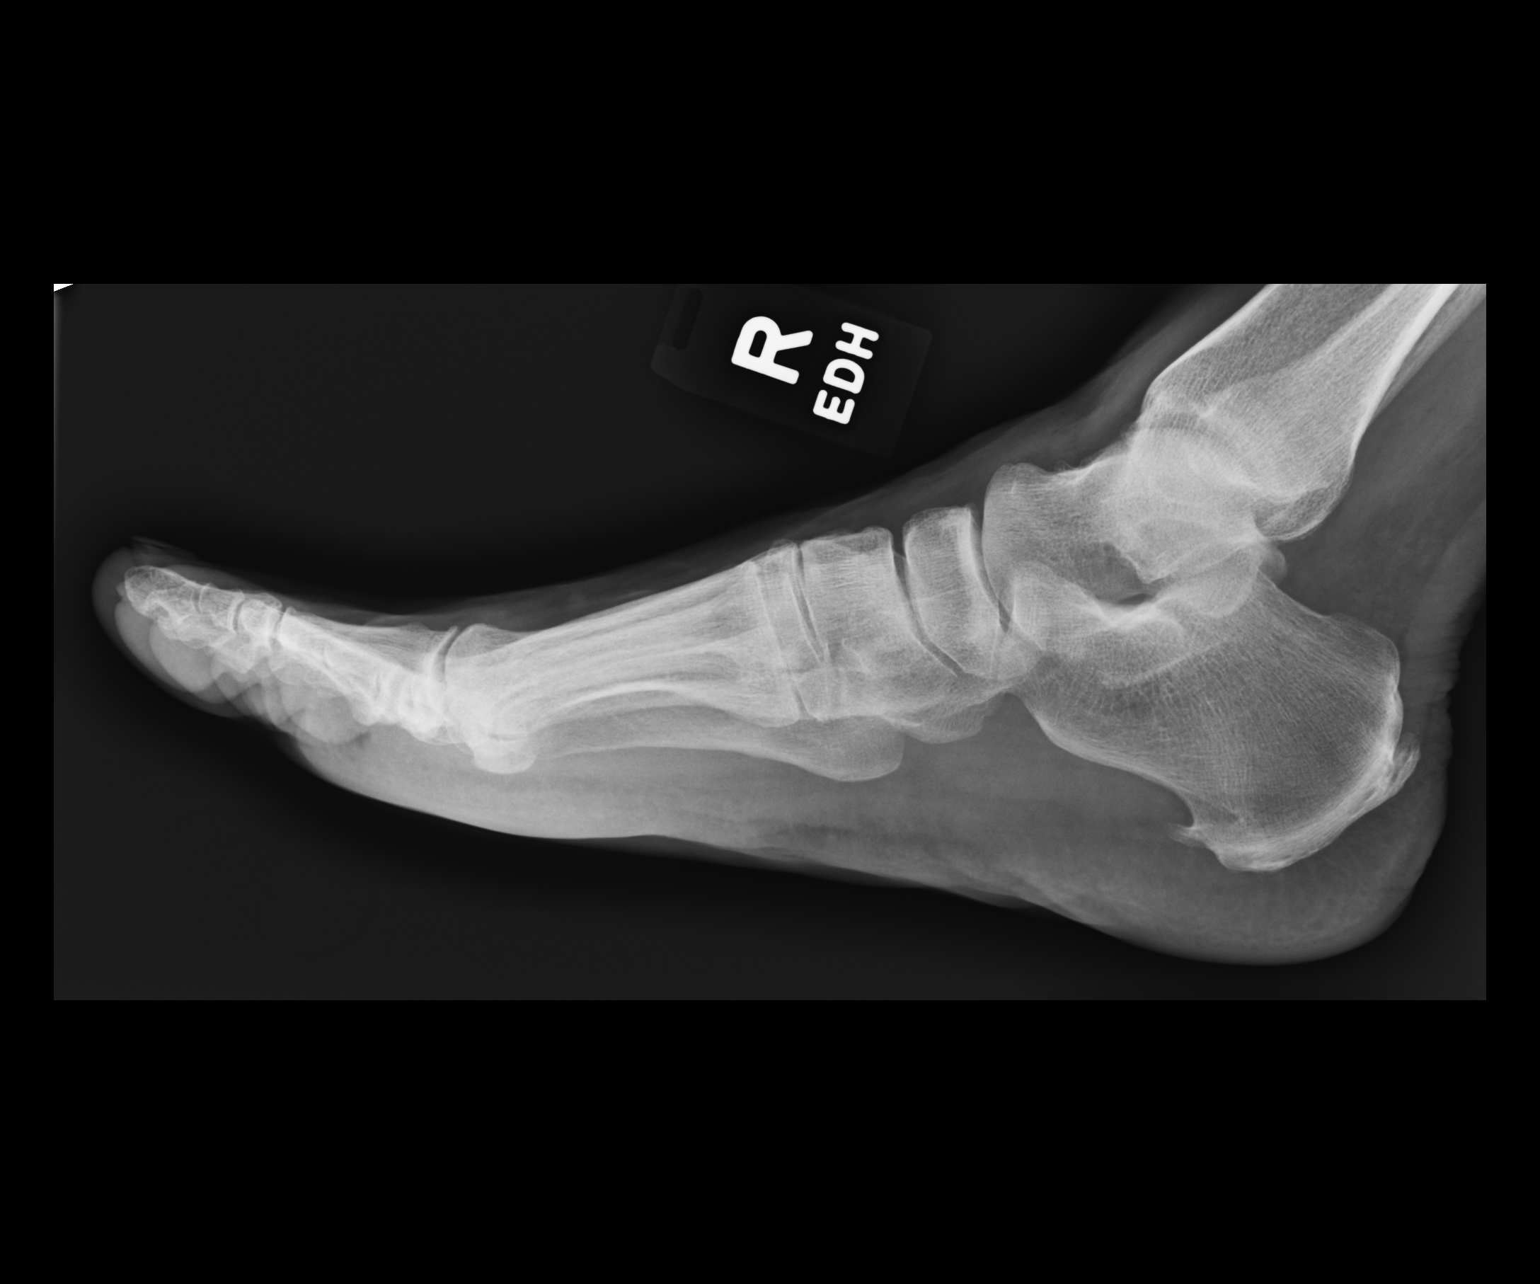

[2 of 2 positions shown; findings below may reference images not displayed]

FINDINGS: Normal talus and tarsal bones.

There is a small plantar calcaneal spur.
Normal visualized subtalar, talonavicular, calcaneocuboid, tarsal
and tarsometatarsal articulations.
Normal metatarsi.
Mild degenerative changes seen involving the metatarsophalangeal
joint of the right great toe. Normal tibial and fibular sesamoid
bones. Normal interphalangeal joint of the right great toe. Normal
phalanges of the right great toe.
Normal second through fifth metatarsophalangeal joints. Normal
interphalangeal joints of the lesser toes. Normal phalanges of the
lesser toes.
IMPRESSION: 1. No acute osseous abnormality.
2. Small plantar calcaneal spur.

## 2021-12-26 DIAGNOSIS — R059 Cough, unspecified: Secondary | ICD-10-CM | POA: Diagnosis not present

## 2021-12-26 DIAGNOSIS — R52 Pain, unspecified: Secondary | ICD-10-CM | POA: Diagnosis not present

## 2021-12-26 DIAGNOSIS — R0981 Nasal congestion: Secondary | ICD-10-CM | POA: Diagnosis not present

## 2021-12-26 DIAGNOSIS — R6883 Chills (without fever): Secondary | ICD-10-CM | POA: Diagnosis not present

## 2021-12-26 DIAGNOSIS — Z03818 Encounter for observation for suspected exposure to other biological agents ruled out: Secondary | ICD-10-CM | POA: Diagnosis not present

## 2022-08-11 DIAGNOSIS — R0981 Nasal congestion: Secondary | ICD-10-CM | POA: Diagnosis not present

## 2022-08-11 DIAGNOSIS — R062 Wheezing: Secondary | ICD-10-CM | POA: Diagnosis not present

## 2022-08-11 DIAGNOSIS — J069 Acute upper respiratory infection, unspecified: Secondary | ICD-10-CM | POA: Diagnosis not present

## 2022-10-23 DIAGNOSIS — R030 Elevated blood-pressure reading, without diagnosis of hypertension: Secondary | ICD-10-CM | POA: Diagnosis not present

## 2022-10-23 DIAGNOSIS — E639 Nutritional deficiency, unspecified: Secondary | ICD-10-CM | POA: Diagnosis not present

## 2022-12-14 DIAGNOSIS — L239 Allergic contact dermatitis, unspecified cause: Secondary | ICD-10-CM | POA: Diagnosis not present

## 2023-06-19 DIAGNOSIS — R197 Diarrhea, unspecified: Secondary | ICD-10-CM | POA: Diagnosis not present

## 2023-06-20 DIAGNOSIS — R197 Diarrhea, unspecified: Secondary | ICD-10-CM | POA: Diagnosis not present

## 2023-09-25 ENCOUNTER — Ambulatory Visit: Payer: BC Managed Care – PPO | Admitting: Podiatry

## 2023-09-25 ENCOUNTER — Ambulatory Visit (INDEPENDENT_AMBULATORY_CARE_PROVIDER_SITE_OTHER): Payer: BC Managed Care – PPO

## 2023-09-25 ENCOUNTER — Encounter: Payer: Self-pay | Admitting: Podiatry

## 2023-09-25 DIAGNOSIS — M19072 Primary osteoarthritis, left ankle and foot: Secondary | ICD-10-CM | POA: Diagnosis not present

## 2023-09-25 DIAGNOSIS — M778 Other enthesopathies, not elsewhere classified: Secondary | ICD-10-CM

## 2023-09-25 NOTE — Progress Notes (Signed)
  Subjective:  Patient ID: Nathan Morris, male    DOB: 1965-12-17,  MRN: [id number]  Chief Complaint  Patient presents with   Foot Pain    RM#11 Left foot pain around toe area pain comes and goes for about 3 months now no relief with over the counter pain relievers.     58 y.o. male presents with the above complaint.  Patient presents with left first metatarsophalangeal joint pain hurts with ambulation or shoe pressure he has not seen MRIs prior to seeing me he would like to discuss treatment options going for 3 months no relief tried over-the-counter pain reliever pain scale is 5 out of 10 dull aching nature   Review of Systems: Negative except as noted in the HPI. Denies N/V/F/Ch.  Past Medical History:  Diagnosis Date   Hypertension     Current Outpatient Medications:    acetaminophen (TYLENOL) 500 MG tablet, Take 1,000 mg by mouth every 6 (six) hours as needed for mild pain., Disp: , Rfl:    cyclobenzaprine (FLEXERIL) 10 MG tablet, Take 1 tablet (10 mg total) by mouth 2 (two) times daily as needed for muscle spasms., Disp: 20 tablet, Rfl: 0   diclofenac (VOLTAREN) 75 MG EC tablet, Take 1 tablet (75 mg total) by mouth 2 (two) times daily., Disp: 50 tablet, Rfl: 2   hydrochlorothiazide (HYDRODIURIL) 25 MG tablet, Take 1 tablet (25 mg total) by mouth daily., Disp: 30 tablet, Rfl: 1  Social History   Tobacco Use  Smoking Status Never  Smokeless Tobacco Never    No Known Allergies Objective:  There were no vitals filed for this visit. There is no height or weight on file to calculate BMI. Constitutional Well developed. Well nourished.  Vascular Dorsalis pedis pulses palpable bilaterally. Posterior tibial pulses palpable bilaterally. Capillary refill normal to all digits.  No cyanosis or clubbing noted. Pedal hair growth normal.  Neurologic Normal speech. Oriented to person, place, and time. Epicritic sensation to light touch grossly present bilaterally.  Dermatologic  Nails well groomed and normal in appearance. No open wounds. No skin lesions.  Orthopedic: Pain on palpation left first metatarsophalangeal joint pain with range of motion of the joint limited range of motion noted for dorsiflexion.  No pain on palpation plantarflexion some crepitus noted deep intra-articular pain noted   Radiographs: Pain on palpation to the left first metatarsophalangeal joint.  There is decreasing joint space narrowing with some arthritic changes of the metatarsal head.  No loose bodies or osteophytes noted.  Findings consistent of hallux rigidus Assessment:   1. Arthritis of first metatarsophalangeal (MTP) joint of left foot    Plan:  Patient was evaluated and treated and all questions answered.  Left first MPJ arthritis -All questions and concerns were discussed with patient extensive detail given the amount of pain that he is having he will benefit from a steroid injection of decreasing inflammatory component surgical pain.  Patient agrees with plan like to proceed with steroid injection -A steroid injection was performed left first MTP using 1% plain Lidocaine and 10 mg of Kenalog. This was well tolerated.   No follow-ups on file.

## 2023-09-26 ENCOUNTER — Ambulatory Visit: Payer: BC Managed Care – PPO | Admitting: Podiatry

## 2023-12-13 DIAGNOSIS — M7541 Impingement syndrome of right shoulder: Secondary | ICD-10-CM | POA: Diagnosis not present

## 2023-12-13 DIAGNOSIS — I10 Essential (primary) hypertension: Secondary | ICD-10-CM | POA: Diagnosis not present

## 2024-03-16 DIAGNOSIS — M542 Cervicalgia: Secondary | ICD-10-CM | POA: Diagnosis not present

## 2024-03-31 ENCOUNTER — Ambulatory Visit: Admitting: Podiatry

## 2024-03-31 DIAGNOSIS — B353 Tinea pedis: Secondary | ICD-10-CM | POA: Diagnosis not present

## 2024-03-31 MED ORDER — GENTAMICIN SULFATE 0.1 % EX OINT: 1.0000 | TOPICAL_OINTMENT | Freq: Three times a day (TID) | CUTANEOUS | 0 refills | Status: AC

## 2024-03-31 MED ORDER — CICLOPIROX 0.77 % EX GEL: 1.0000 | Freq: Two times a day (BID) | CUTANEOUS | 0 refills | Status: DC

## 2024-03-31 NOTE — Progress Notes (Unsigned)
  Subjective:  Patient ID: Nathan Morris, male    DOB: July 24, 1966,  MRN: [id number]  Chief Complaint  Patient presents with   Tinea Pedis    Patient is here for bilateral athletes foot right foot worse than the left side    Discussed the use of AI scribe software for clinical note transcription with the patient, who gave verbal consent to proceed.  History of Present Illness Nathan Morris is a 58 year old male who presents with athlete's foot.  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 Over-the-counter sprays provide minimal relief. Symptoms include itching.  He wears steel-toe shoes, which increase foot sweating, particularly in hot weather and during physical activity at work. Excessive sweating exacerbates the condition.  He has no medication allergies and is generally healthy, with slightly elevated blood pressure. He does not smoke but consumes beer daily.      Objective:    Physical Exam SKIN: Interdigital skin breakdown between right second and third toe.   No images are attached to the encounter.    Results     Assessment:   1. Tinea pedis of both feet      Plan:  Patient was evaluated and treated and all questions answered.  Assessment and Plan Assessment & Plan Tinea pedis with secondary skin breakdown, right foot Tinea pedis with skin breakdown between second and third toes, risk of secondary bacterial infection. - Prescribed antifungal gel for application between toes. - Prescribed antibiotic ointment for application between toes. - Advised changing shoes and socks regularly. - Recommended antiperspirant spray on feet to control moisture. - Instructed to dry feet thoroughly, especially between toes, after showering. - Suggested soaking feet in warm water with vinegar or Betadine. - Advised bringing extra socks to work and changing midday. - Recommended wearing natural fiber or  moisture-wicking socks. - Instructed to monitor for signs of infection such as increased redness or drainage. - Advised to contact if symptoms worsen or do not improve.      No follow-ups on file.

## 2024-03-31 NOTE — Patient Instructions (Signed)
  VISIT SUMMARY: You came in today because of athlete's foot, which has been causing itching and discomfort for the past two weeks, especially on your right foot. Over-the-counter sprays have not been very effective. We discussed how your steel-toe shoes and foot sweating might be contributing to the problem.  YOUR PLAN: -TINEA PEDIS WITH SECONDARY SKIN BREAKDOWN, RIGHT FOOT: Tinea pedis, commonly known as athlete's foot, is a fungal infection that affects the skin on your feet. It can cause itching, redness, and skin breakdown, especially between the toes. To treat this, I have prescribed an antifungal gel and an antibiotic ointment to apply between your toes. You should also change your shoes and socks regularly, use an antiperspirant spray on your feet to control moisture, and dry your feet thoroughly after showering. Soaking your feet in warm water with vinegar or Betadine can also help. Additionally, bring extra socks to work and change them midday, and wear natural fiber or moisture-wicking socks. Monitor for signs of infection like increased redness or drainage, and contact me if your symptoms worsen or do not improve.  INSTRUCTIONS: Please follow the treatment plan as discussed. If your symptoms do not improve or if they worsen, contact our office. Monitor for signs of infection such as increased redness or drainage.                      Contains text generated by Abridge.                                 Contains text generated by Abridge.

## 2024-04-27 ENCOUNTER — Other Ambulatory Visit: Payer: Self-pay | Admitting: Podiatry

## 2024-05-06 DIAGNOSIS — I10 Essential (primary) hypertension: Secondary | ICD-10-CM | POA: Diagnosis not present

## 2024-05-06 DIAGNOSIS — B353 Tinea pedis: Secondary | ICD-10-CM | POA: Diagnosis not present

## 2024-06-01 DIAGNOSIS — B353 Tinea pedis: Secondary | ICD-10-CM | POA: Diagnosis not present
